# Patient Record
Sex: Male | Born: 1995 | Race: White | Hispanic: No | Marital: Single | Smoking: Never smoker
Health system: Southern US, Community
[De-identification: ages and names within clinical notes are randomized; demographics above are authoritative.]

## PROBLEM LIST (undated history)

## (undated) DIAGNOSIS — F909 Attention-deficit hyperactivity disorder, unspecified type: Secondary | ICD-10-CM

---
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
## 2014-04-28 IMAGING — US US MISC SOFT TISSUE
1 series · 14 of 15 positions shown · non-contrast
Comparison: None.

CLINICAL DATA: Left elbow wound infection.  Evaluate for abscess.

LEFT UPPER EXTREMITY LIMITED SOFT TISSUE ULTRASOUND
TECHNIQUE: Ultrasound examination of the upper extremity soft
tissues was performed in the area of clinical concern.

[Series 1: us misc soft tissue · 0.08mm/px · 15 acquisitions, 14 frames shown]
[im 1/15]
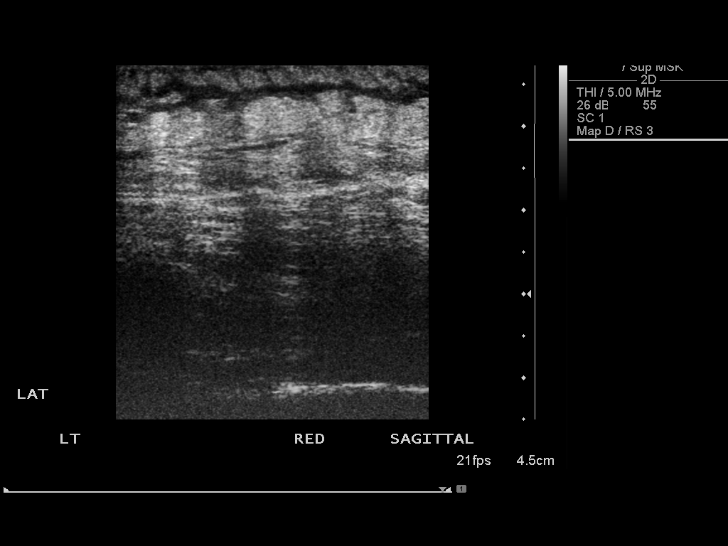
[im 2/15]
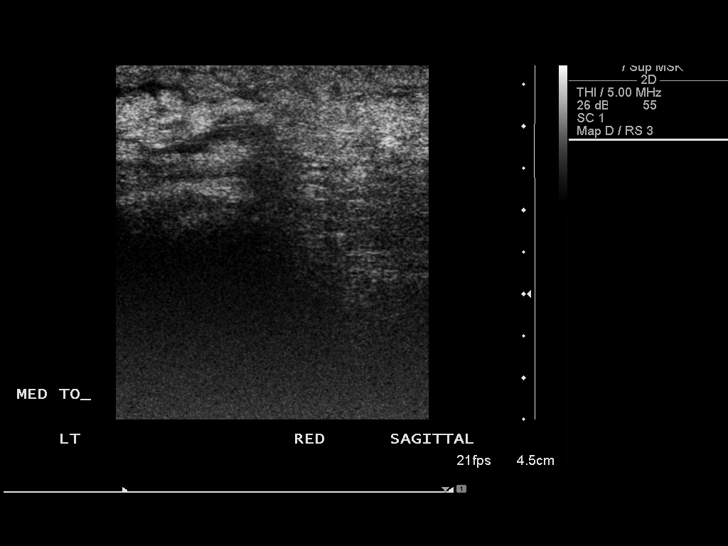
[im 3/15]
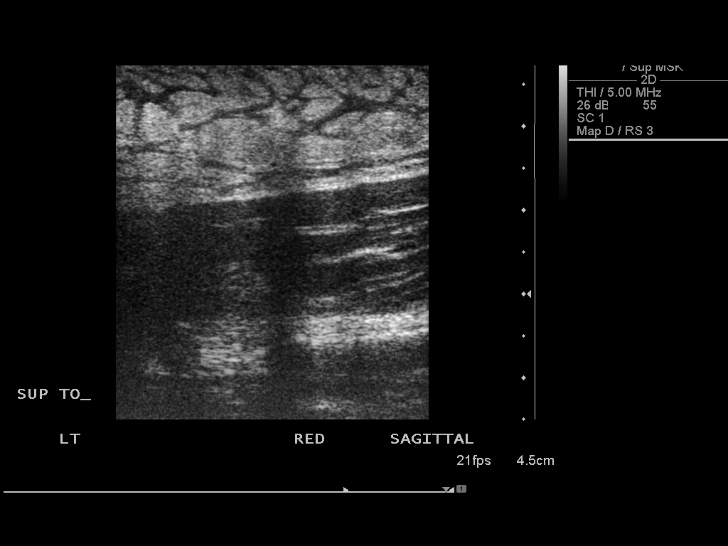
[im 4/15]
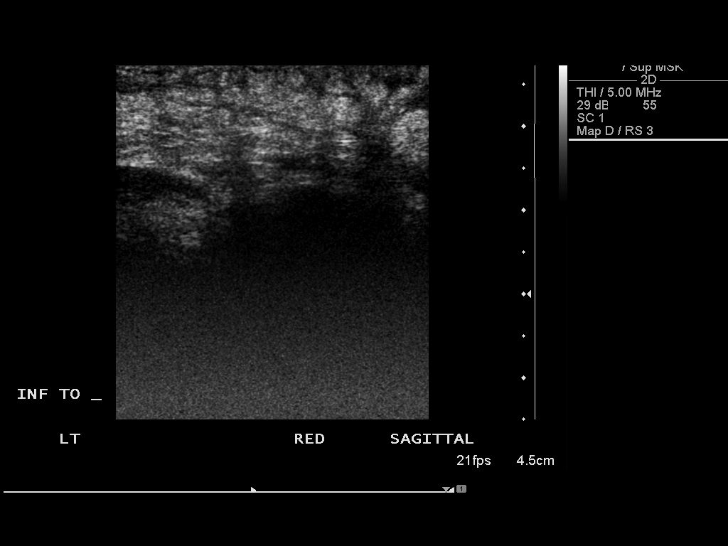
[im 5/15]
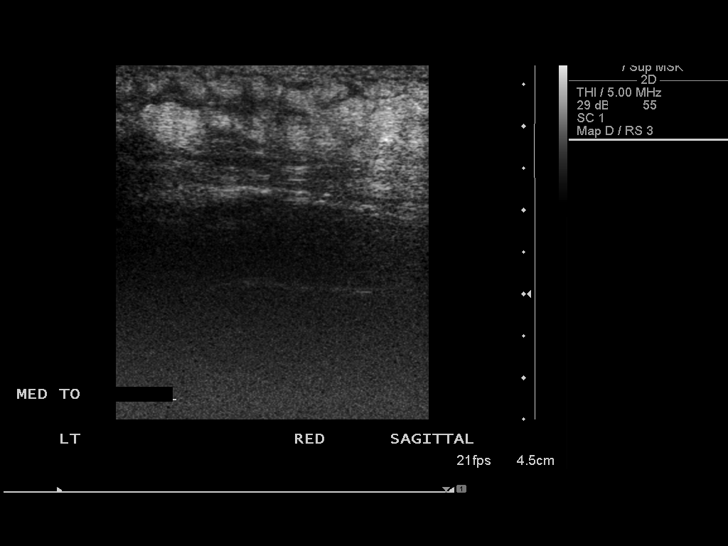
[im 6/15]
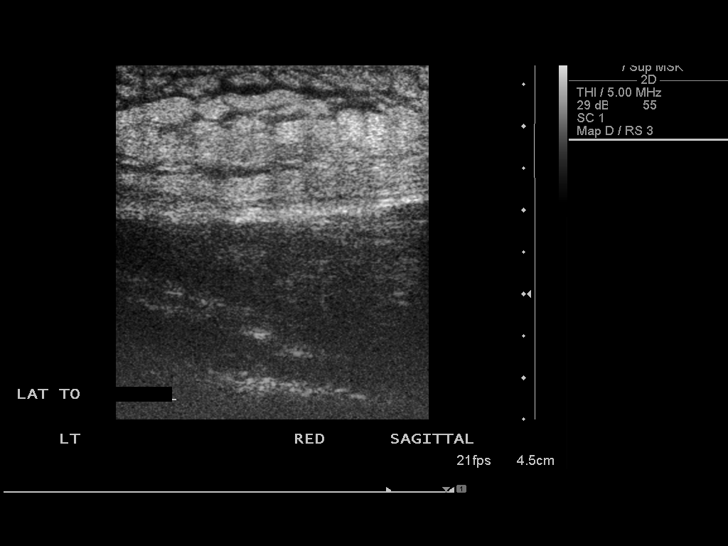
[im 7/15]
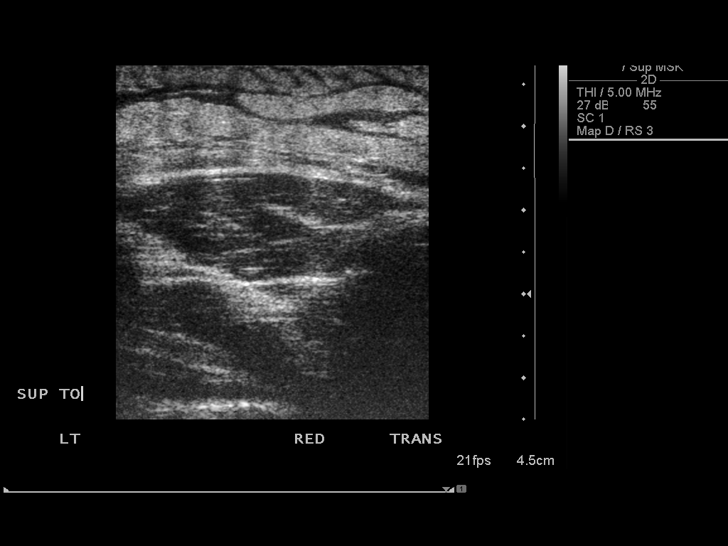
[im 9/15]
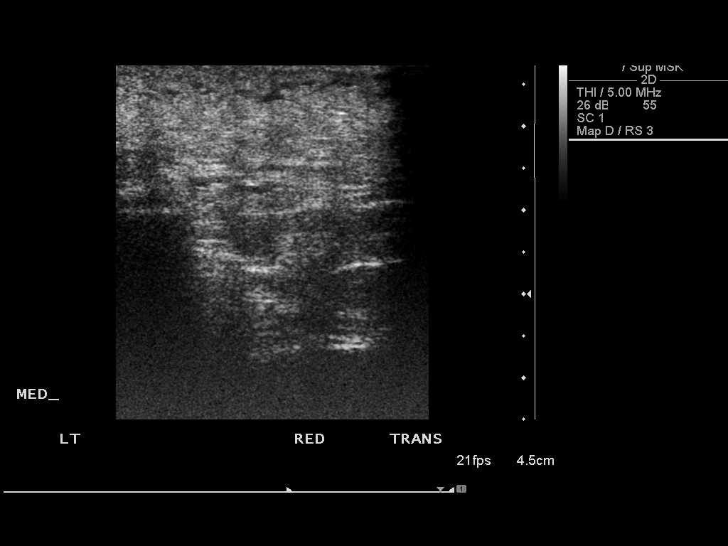
[im 10/15]
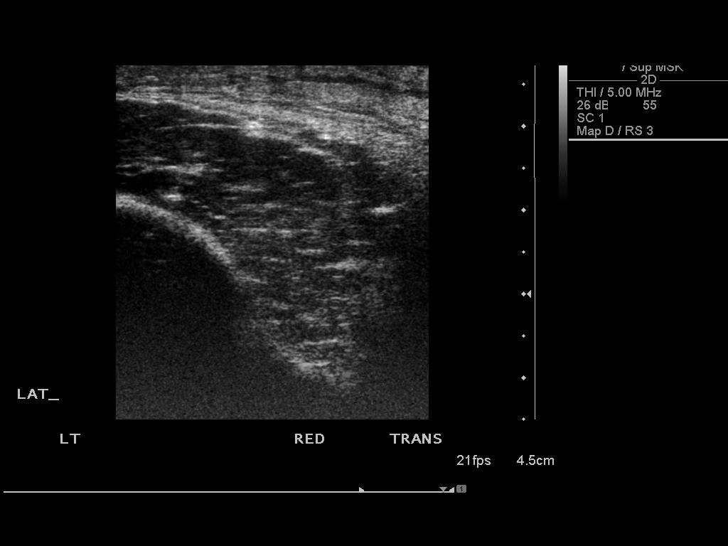
[im 11/15]
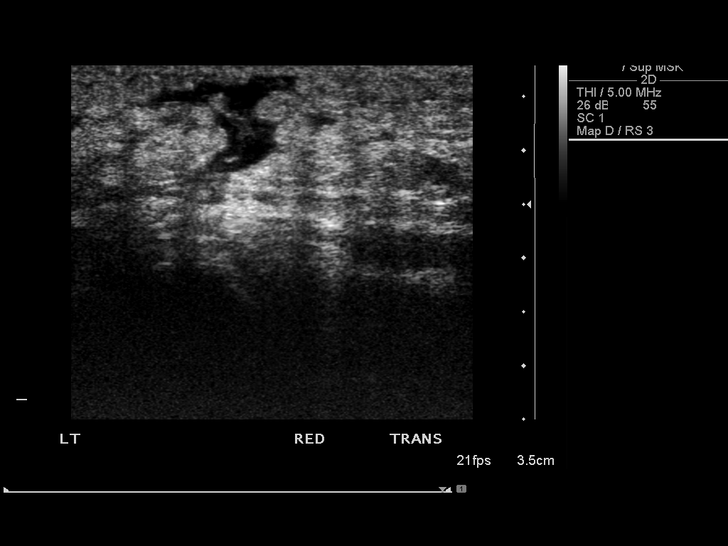
[im 12/15]
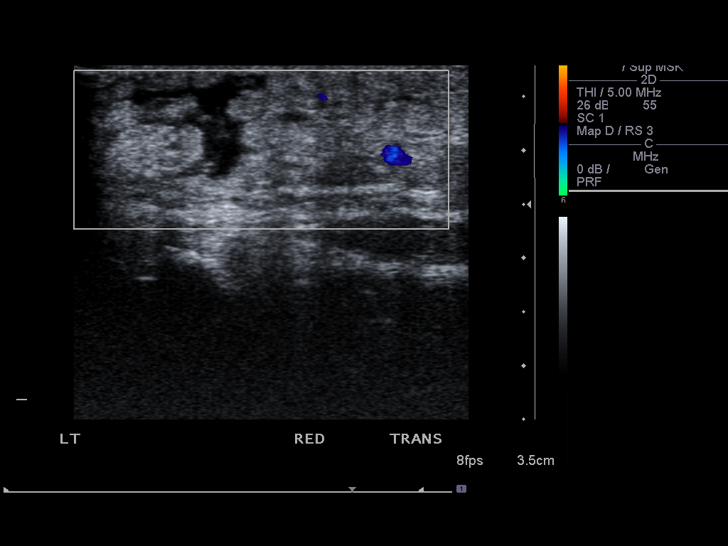
[im 13/15]
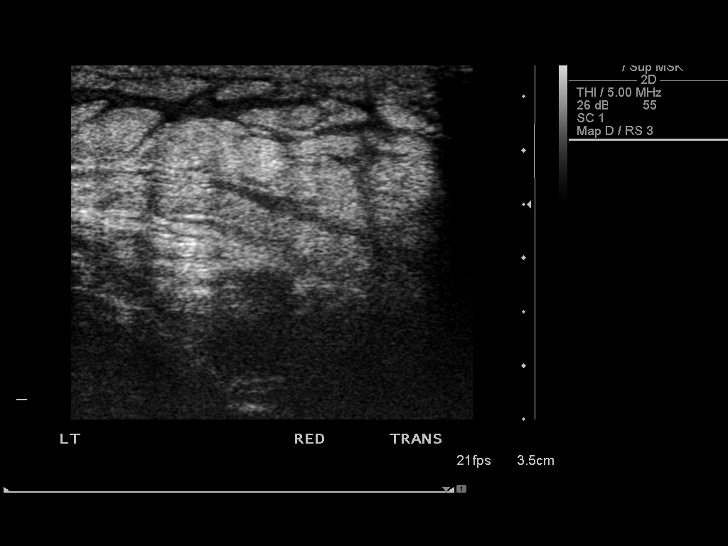
[im 14/15]
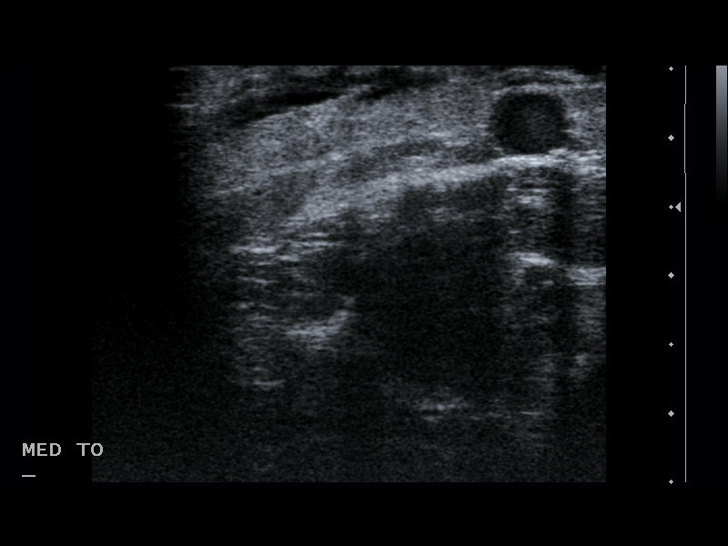
[im 15/15]
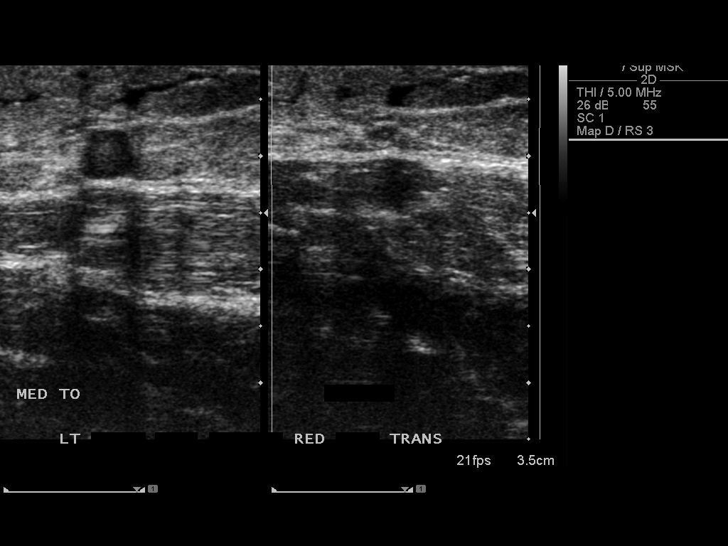

[14 of 15 positions shown; findings below may reference images not displayed]

FINDINGS: Targeted ultrasound examination at site of soft tissue
swelling posterior to the elbow joint shows diffuse edema within
the subcutaneous tissues.  No focal fluid collection is visualized
to suggest the presence of an abscess.
IMPRESSION: Diffuse edema within subcutaneous tissues of the elbow.  No focal
abscess visualized.

## 2014-10-25 ENCOUNTER — Ambulatory Visit (INDEPENDENT_AMBULATORY_CARE_PROVIDER_SITE_OTHER): Payer: 59 | Admitting: Family Medicine

## 2014-10-25 VITALS — BP 122/80 | HR 80 | Temp 98.2°F | Resp 18 | Ht 77.0 in | Wt 294.8 lb

## 2014-10-25 DIAGNOSIS — L0201 Cutaneous abscess of face: Secondary | ICD-10-CM

## 2014-10-25 DIAGNOSIS — L03211 Cellulitis of face: Secondary | ICD-10-CM | POA: Diagnosis not present

## 2014-10-25 NOTE — Patient Instructions (Addendum)
Take doxy twice a day for the next 10 days. Warm compresses 3 times a day. If you are not getting significantly better in 3 days, go to student health for follow up. If you develop eye pain, worsening swelling, or fever, be seen immediately.
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
## 2014-11-17 NOTE — Progress Notes (Signed)
Patient ID: Gerald Ramos, male   DOB: 1995-08-13, 19 y.o.   MRN: [id number] Pt assessed, reviewed documentation and agree w/ assessment and plan. Norberto Sorenson, MD MPH

## 2019-03-11 ENCOUNTER — Ambulatory Visit: Payer: 59 | Attending: Internal Medicine

## 2019-03-11 DIAGNOSIS — Z20822 Contact with and (suspected) exposure to covid-19: Secondary | ICD-10-CM

## 2019-03-13 LAB — NOVEL CORONAVIRUS, NAA: SARS-CoV-2, NAA: NOT DETECTED
